# Patient Record
Sex: Male | Born: 2000 | Race: Black or African American | Hispanic: No | Marital: Single | Smoking: Never smoker
Health system: Southern US, Community
[De-identification: ages and names within clinical notes are randomized; demographics above are authoritative.]

## PROBLEM LIST (undated history)

## (undated) ENCOUNTER — Ambulatory Visit (HOSPITAL_COMMUNITY): Admission: EM | Payer: Self-pay

---
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
## 2021-09-15 ENCOUNTER — Other Ambulatory Visit: Payer: Self-pay

## 2021-09-16 ENCOUNTER — Ambulatory Visit
Admission: RE | Admit: 2021-09-16 | Discharge: 2021-09-16 | Disposition: A | Discharge status: Home / Self Care | Payer: Medicaid Other | Source: Ambulatory Visit | Attending: Physician Assistant | Admitting: Physician Assistant

## 2021-09-16 ENCOUNTER — Other Ambulatory Visit: Payer: Self-pay

## 2021-09-16 VITALS — BP 129/74 | HR 96 | Temp 100.0°F | Resp 20 | Ht 70.0 in | Wt 140.0 lb

## 2021-09-16 DIAGNOSIS — J069 Acute upper respiratory infection, unspecified: Secondary | ICD-10-CM | POA: Insufficient documentation

## 2021-09-16 LAB — POCT RAPID STREP A (OFFICE): Rapid Strep A Screen: NEGATIVE

## 2021-09-16 NOTE — ED Provider Notes (Signed)
[facility]    CSN: [id number] Arrival date & time: 09/16/21  1548      History   Chief Complaint Chief Complaint  Patient presents with   Appointment   Fever    HPI PHI AVANS is a 20 y.o. male.   Patient here today for evaluation of low grade fever, body aches and chills he has had the last 4-5 days. He has taken dayquil and nyquil with mild relief.   The history is provided by the patient.  Fever Associated symptoms: chills, congestion, headaches and myalgias   Associated symptoms: no cough and no rhinorrhea    History reviewed. No pertinent past medical history.  There are no problems to display for this patient.   History reviewed. No pertinent surgical history.     Home Medications    Prior to Admission medications   Medication Sig Start Date End Date Taking? Authorizing Provider  naproxen (NAPROSYN) 500 MG tablet Take 1 tablet (500 mg total) by mouth 2 (two) times daily. 07/19/20   Georgetta Haber, NP    Family History History reviewed. No pertinent family history.  Social History Social History   Tobacco Use   Smoking status: Never   Smokeless tobacco: Never     Allergies   Patient has no known allergies.   Review of Systems Review of Systems  Constitutional:  Positive for chills and fever.  HENT:  Positive for congestion. Negative for rhinorrhea.   Eyes:  Negative for discharge and redness.  Respiratory:  Negative for cough and shortness of breath.   Musculoskeletal:  Positive for myalgias.  Neurological:  Positive for headaches.    Physical Exam Triage Vital Signs ED Triage Vitals  Enc Vitals Group     BP 09/16/21 1602 129/74     Pulse Rate 09/16/21 1602 96     Resp 09/16/21 1602 20     Temp 09/16/21 1602 100 F (37.8 C)     Temp Source 09/16/21 1602 Oral     SpO2 09/16/21 1602 98 %     Weight 09/16/21 1603 140 lb (63.5 kg)     Height 09/16/21 1603 5\' 10"  (1.778 m)     Head Circumference --      Peak  Flow --      Pain Score 09/16/21 1603 0     Pain Loc --      Pain Edu? --      Excl. in GC? --    No data found.  Updated Vital Signs BP 129/74 (BP Location: Left Arm)    Pulse 96    Temp 100 F (37.8 C) (Oral)    Resp 20    Ht 5\' 10"  (1.778 m)    Wt 140 lb (63.5 kg)    SpO2 98%    BMI 20.09 kg/m   Physical Exam Vitals and nursing note reviewed.  Constitutional:      General: He is not in acute distress.    Appearance: Normal appearance. He is not ill-appearing.  HENT:     Head: Normocephalic and atraumatic.     Nose: Congestion present.     Mouth/Throat:     Mouth: Mucous membranes are moist.     Pharynx: Oropharynx is clear. No oropharyngeal exudate or posterior oropharyngeal erythema.  Eyes:     Conjunctiva/sclera: Conjunctivae normal.  Cardiovascular:     Rate and Rhythm: Normal rate and regular rhythm.     Heart sounds: Normal heart sounds. No murmur heard.  Pulmonary:     Effort: Pulmonary effort is normal. No respiratory distress.     Breath sounds: Normal breath sounds. No wheezing, rhonchi or rales.  Skin:    General: Skin is warm and dry.  Neurological:     Mental Status: He is alert.  Psychiatric:        Mood and Affect: Mood normal.        Thought Content: Thought content normal.     UC Treatments / Results  Labs (all labs ordered are listed, but only abnormal results are displayed) Labs Reviewed  COVID-19, FLU A+B NAA  CULTURE, GROUP A STREP [facility])  POCT RAPID STREP A (OFFICE)    EKG   Radiology No results found.  Procedures Procedures (including critical care time)  Medications Ordered in UC Medications - No data to display  Initial Impression / Assessment and Plan / UC Course  I have reviewed the triage vital signs and the nursing notes.  Pertinent labs & imaging results that were available during my care of the patient were reviewed by me and considered in my medical decision making (see chart for details).    Suspect viral etiology  of symptoms. Will order covid and flu screening. Rapid strep negative in office. Encouraged follow up with any further concerns.   Final Clinical Impressions(s) / UC Diagnoses   Final diagnoses:  Viral upper respiratory tract infection   Discharge Instructions   None    ED Prescriptions   None    PDMP not reviewed this encounter.   Tomi Bamberger, PA-C 09/16/21 1714

## 2021-09-16 NOTE — ED Triage Notes (Signed)
Patient c/o just not feeling well, low grade fever, no appetite, body aches and chills.  Patient has been taken Dayquil and Nyquil.  Headache.

## 2021-09-17 LAB — COVID-19, FLU A+B NAA
Influenza A, NAA: NOT DETECTED
Influenza B, NAA: NOT DETECTED
SARS-CoV-2, NAA: DETECTED — AB

## 2021-09-19 LAB — CULTURE, GROUP A STREP (THRC)

## 2023-06-06 ENCOUNTER — Ambulatory Visit
Admission: RE | Admit: 2023-06-06 | Discharge: 2023-06-06 | Disposition: A | Discharge status: Home / Self Care | Payer: Medicaid Other | Source: Ambulatory Visit | Attending: Internal Medicine | Admitting: Internal Medicine

## 2023-06-06 VITALS — BP 126/72 | HR 95 | Temp 99.0°F | Resp 16

## 2023-06-06 DIAGNOSIS — Z113 Encounter for screening for infections with a predominantly sexual mode of transmission: Secondary | ICD-10-CM | POA: Diagnosis present
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
## 2023-06-06 NOTE — Discharge Instructions (Signed)
Your STD testing is pending.  Will call if anything is positive.

## 2023-06-06 NOTE — ED Triage Notes (Signed)
Pt presents to the office for sti-testing. Denies any symptoms.

## 2023-06-07 ENCOUNTER — Telehealth: Payer: Self-pay

## 2023-06-07 MED ORDER — DOXYCYCLINE HYCLATE 100 MG PO CAPS: 100.0000 mg | ORAL_CAPSULE | Freq: Two times a day (BID) | ORAL | 0 refills | Status: AC

## 2023-06-07 NOTE — Telephone Encounter (Signed)
Pt requires tx with Doxycycline. Attempted to reach patient x1. LVM. Rx sent to pharmacy on file.
